# Patient Record
Sex: Male | Born: 1955 | Race: White | Hispanic: No | Marital: Married | Smoking: Never smoker
Health system: Southern US, Community
[De-identification: ages and names within clinical notes are randomized; demographics above are authoritative.]

## PROBLEM LIST (undated history)

## (undated) ENCOUNTER — Emergency Department (HOSPITAL_COMMUNITY): Disposition: A | Discharge status: Home / Self Care | Payer: Self-pay

## (undated) DIAGNOSIS — E279 Disorder of adrenal gland, unspecified: Secondary | ICD-10-CM

## (undated) DIAGNOSIS — C801 Malignant (primary) neoplasm, unspecified: Secondary | ICD-10-CM

## (undated) DIAGNOSIS — D034 Melanoma in situ of scalp and neck: Secondary | ICD-10-CM

## (undated) HISTORY — PX: MELANOMA EXCISION: SHX5266

## (undated) HISTORY — PX: SKIN BIOPSY: SHX1

---

## 2013-12-16 ENCOUNTER — Encounter (HOSPITAL_BASED_OUTPATIENT_CLINIC_OR_DEPARTMENT_OTHER): Payer: Self-pay | Admitting: Emergency Medicine

## 2013-12-16 ENCOUNTER — Emergency Department (HOSPITAL_BASED_OUTPATIENT_CLINIC_OR_DEPARTMENT_OTHER): Payer: 59

## 2013-12-16 ENCOUNTER — Emergency Department (HOSPITAL_BASED_OUTPATIENT_CLINIC_OR_DEPARTMENT_OTHER)
Admission: EM | Admit: 2013-12-16 | Discharge: 2013-12-16 | Disposition: A | Discharge status: Other Acute Inpatient Hospital | Payer: 59 | Attending: Emergency Medicine | Admitting: Emergency Medicine

## 2013-12-16 DIAGNOSIS — R112 Nausea with vomiting, unspecified: Secondary | ICD-10-CM

## 2013-12-16 DIAGNOSIS — Z8639 Personal history of other endocrine, nutritional and metabolic disease: Secondary | ICD-10-CM | POA: Insufficient documentation

## 2013-12-16 DIAGNOSIS — R197 Diarrhea, unspecified: Secondary | ICD-10-CM | POA: Insufficient documentation

## 2013-12-16 DIAGNOSIS — R21 Rash and other nonspecific skin eruption: Secondary | ICD-10-CM | POA: Insufficient documentation

## 2013-12-16 DIAGNOSIS — Z85820 Personal history of malignant melanoma of skin: Secondary | ICD-10-CM | POA: Insufficient documentation

## 2013-12-16 DIAGNOSIS — I959 Hypotension, unspecified: Secondary | ICD-10-CM | POA: Insufficient documentation

## 2013-12-16 DIAGNOSIS — R109 Unspecified abdominal pain: Secondary | ICD-10-CM | POA: Insufficient documentation

## 2013-12-16 DIAGNOSIS — R509 Fever, unspecified: Secondary | ICD-10-CM | POA: Insufficient documentation

## 2013-12-16 DIAGNOSIS — R51 Headache: Secondary | ICD-10-CM | POA: Insufficient documentation

## 2013-12-16 DIAGNOSIS — E860 Dehydration: Secondary | ICD-10-CM | POA: Insufficient documentation

## 2013-12-16 DIAGNOSIS — IMO0002 Reserved for concepts with insufficient information to code with codable children: Secondary | ICD-10-CM | POA: Insufficient documentation

## 2013-12-16 DIAGNOSIS — Z862 Personal history of diseases of the blood and blood-forming organs and certain disorders involving the immune mechanism: Secondary | ICD-10-CM | POA: Insufficient documentation

## 2013-12-16 DIAGNOSIS — Z79899 Other long term (current) drug therapy: Secondary | ICD-10-CM | POA: Insufficient documentation

## 2013-12-16 HISTORY — DX: Melanoma in situ of scalp and neck: D03.4

## 2013-12-16 HISTORY — DX: Disorder of adrenal gland, unspecified: E27.9

## 2013-12-16 HISTORY — DX: Malignant (primary) neoplasm, unspecified: C80.1

## 2013-12-16 LAB — I-STAT CG4 LACTIC ACID, ED
Lactic Acid, Venous: 1.74 mmol/L (ref 0.5–2.2)
Lactic Acid, Venous: 1.16 mmol/L (ref 0.5–2.2)

## 2013-12-16 LAB — CBC WITH DIFFERENTIAL/PLATELET
BASOS ABS: 0 10*3/uL (ref 0.0–0.1)
Basophils Relative: 0 % (ref 0–1)
EOS ABS: 0.1 10*3/uL (ref 0.0–0.7)
EOS PCT: 1 % (ref 0–5)
HCT: 44.1 % (ref 39.0–52.0)
Hemoglobin: 15.1 g/dL (ref 13.0–17.0)
LYMPHS ABS: 1.8 10*3/uL (ref 0.7–4.0)
LYMPHS PCT: 25 % (ref 12–46)
MCH: 30.2 pg (ref 26.0–34.0)
MCHC: 34.2 g/dL (ref 30.0–36.0)
MCV: 88.2 fL (ref 78.0–100.0)
Monocytes Absolute: 0.5 10*3/uL (ref 0.1–1.0)
Monocytes Relative: 7 % (ref 3–12)
NEUTROS PCT: 66 % (ref 43–77)
Neutro Abs: 4.7 10*3/uL (ref 1.7–7.7)
PLATELETS: 193 10*3/uL (ref 150–400)
RBC: 5 MIL/uL (ref 4.22–5.81)
RDW: 13.3 % (ref 11.5–15.5)
WBC: 7.1 10*3/uL (ref 4.0–10.5)

## 2013-12-16 LAB — COMPREHENSIVE METABOLIC PANEL
ALT: 20 U/L (ref 0–53)
AST: 22 U/L (ref 0–37)
Albumin: 3.9 g/dL (ref 3.5–5.2)
Alkaline Phosphatase: 56 U/L (ref 39–117)
BUN: 33 mg/dL — ABNORMAL HIGH (ref 6–23)
CALCIUM: 9.2 mg/dL (ref 8.4–10.5)
CO2: 24 meq/L (ref 19–32)
Chloride: 100 mEq/L (ref 96–112)
Creatinine, Ser: 1.6 mg/dL — ABNORMAL HIGH (ref 0.50–1.35)
GFR calc Af Amer: 53 mL/min — ABNORMAL LOW (ref >90)
GFR calc non Af Amer: 46 mL/min — ABNORMAL LOW (ref >90)
Glucose, Bld: 105 mg/dL — ABNORMAL HIGH (ref 70–99)
POTASSIUM: 3.9 meq/L (ref 3.7–5.3)
SODIUM: 139 meq/L (ref 137–147)
TOTAL PROTEIN: 7.1 g/dL (ref 6.0–8.3)
Total Bilirubin: 1.2 mg/dL (ref 0.3–1.2)

## 2013-12-16 LAB — LIPASE, BLOOD: Lipase: 43 U/L (ref 11–59)

## 2013-12-16 MED ORDER — SODIUM CHLORIDE 0.9 % IV BOLUS (SEPSIS)
1000.0000 mL | Freq: Once | INTRAVENOUS | Status: AC
Start: 2013-12-16 — End: 2013-12-16
  Administered 2013-12-16: 1000 mL via INTRAVENOUS

## 2013-12-16 MED ORDER — ONDANSETRON HCL 4 MG/2ML IJ SOLN
4.0000 mg | Freq: Once | INTRAMUSCULAR | Status: AC
  Administered 2013-12-16: 4 mg via INTRAVENOUS
  Filled 2013-12-16: qty 2

## 2013-12-16 MED ORDER — ACETAMINOPHEN 325 MG PO TABS
650.0000 mg | ORAL_TABLET | Freq: Once | ORAL | Status: AC
Start: 2013-12-16 — End: 2013-12-16
  Administered 2013-12-16: 650 mg via ORAL
  Filled 2013-12-16: qty 2

## 2013-12-16 MED ORDER — ONDANSETRON HCL 4 MG/2ML IJ SOLN: 4.0000 mg | Freq: Once | INTRAMUSCULAR | Status: DC

## 2013-12-16 MED ORDER — METHYLPREDNISOLONE SODIUM SUCC 125 MG IJ SOLR
125.0000 mg | Freq: Once | INTRAMUSCULAR | Status: AC
Start: 2013-12-16 — End: 2013-12-16
  Administered 2013-12-16: 125 mg via INTRAVENOUS
  Filled 2013-12-16: qty 2

## 2013-12-16 MED ORDER — DEXTROSE 5 % IV SOLN
1.0000 g | Freq: Once | INTRAVENOUS | Status: AC
  Administered 2013-12-16: 1 g via INTRAVENOUS

## 2013-12-16 MED ORDER — IOHEXOL 300 MG/ML  SOLN
25.0000 mL | Freq: Once | INTRAMUSCULAR | Status: AC | PRN
  Administered 2013-12-16: 25 mL via ORAL

## 2013-12-16 MED ORDER — CEFTRIAXONE SODIUM 1 G IJ SOLR
INTRAMUSCULAR | Status: AC
  Filled 2013-12-16: qty 10

## 2013-12-16 MED ORDER — HYDROCORTISONE NA SUCCINATE PF 100 MG IJ SOLR
100.0000 mg | Freq: Once | INTRAMUSCULAR | Status: AC
  Administered 2013-12-16: 100 mg via INTRAVENOUS
  Filled 2013-12-16: qty 2

## 2013-12-16 MED ORDER — SODIUM CHLORIDE 0.9 % IV SOLN: INTRAVENOUS | Status: DC

## 2013-12-16 MED ORDER — NOREPINEPHRINE BITARTRATE 1 MG/ML IJ SOLN
2.0000 ug/min | INTRAVENOUS | Status: DC
  Filled 2013-12-16: qty 4

## 2013-12-16 MED ORDER — SODIUM CHLORIDE 0.9 % IV BOLUS (SEPSIS)
1000.0000 mL | Freq: Once | INTRAVENOUS | Status: AC
  Administered 2013-12-16: 1000 mL via INTRAVENOUS

## 2013-12-16 MED ORDER — SODIUM CHLORIDE 0.9 % IV BOLUS (SEPSIS)
1000.0000 mL | Freq: Once | INTRAVENOUS | Status: AC
  Administered 2013-12-16 (×2): 1000 mL via INTRAVENOUS

## 2013-12-16 NOTE — ED Notes (Signed)
Patient is going to room 727 instead of 651--[facility] is picking up patient.

## 2013-12-16 NOTE — ED Notes (Signed)
Pt denies sob, lungs clear to auscultation.  Heart sounds S1, S2, without murmur.  Pt denies need to void at present.

## 2013-12-16 NOTE — ED Notes (Signed)
Paged [facility] direct--call back to [phone].

## 2013-12-16 NOTE — ED Notes (Signed)
Called [facility] back--return call on Dr. Stark Jock phone [phone]

## 2013-12-16 NOTE — ED Notes (Signed)
Dr. Lavada Mesi will be calling back to [phone] from [facility]

## 2013-12-16 NOTE — ED Notes (Addendum)
N/V/D since yesterday.  Pt states he aches all over.  Fever since yesterday.  Pt has eyes closed, he states he feels weak all over.

## 2013-12-16 NOTE — ED Provider Notes (Signed)
Medical screening examination/treatment/procedure(s) were conducted as a shared visit with non-physician practitioner(s) and myself.  I personally evaluated the patient during the encounter.   EKG Interpretation None     Patient returned 4 hours of low-grade temp along with nonbilious vomiting watery diarrhea. Denies abdominal pain. No cough or shortness of breath. Has been hypotensive here. Lactate is normal. We'll continue with fluid hydration. Abdominal exam is benign. Suspect likely viral illness. We'll monitor  Leota Jacobsen, MD 12/16/13 [phone]

## 2013-12-16 NOTE — ED Provider Notes (Signed)
CSN: [id number]     Arrival date & time 12/16/13  1134 History   First MD Initiated Contact with Patient 12/16/13 1208     Chief Complaint  Patient presents with  . Emesis  . Diarrhea     (Consider location/radiation/quality/duration/timing/severity/associated sxs/prior Treatment) Patient is a 58 y.o. male presenting with vomiting and diarrhea. The history is provided by the patient.  Emesis Severity:  Moderate Duration:  1 day Number of daily episodes:  6 times Quality:  Stomach contents Able to tolerate:  Liquids Progression:  Worsening Chronicity:  New Recent urination:  Normal Relieved by:  Nothing Worsened by:  Nothing tried Associated symptoms: abdominal pain, chills, diarrhea, fever, headaches and myalgias   Associated symptoms: no sore throat   Diarrhea Associated symptoms: abdominal pain, chills, fever, headaches, myalgias and vomiting    Platon Arocho is a 58 y.o. male who presents to the ED with nausea, vomiting and diarrhea that started yesterday. He has had brown watery stools x 6. He complains of headache and aching all over. Fever up to 102. No one else in the family has been sick. He has not taken any medications except phenergan PO yesterday and it did not stay down. Patient sees Dr. Jeralene Huff at [facility]. PMH significant for Melanoma in situ of scalp and adrenal gland dysfunction.   Past Medical History  Diagnosis Date  . Cancer   . Melanoma in situ of scalp   . Adrenal gland dysfunction    No past surgical history on file. No family history on file. History  Substance Use Topics  . Smoking status: Never Smoker   . Smokeless tobacco: Not on file  . Alcohol Use: Not on file    Review of Systems  Constitutional: Positive for fever and chills.  HENT: Negative for congestion, ear pain, facial swelling, sinus pressure and sore throat.   Eyes: Negative for visual disturbance.  Respiratory: Negative for chest tightness and shortness of breath.    Cardiovascular: Negative for chest pain.  Gastrointestinal: Positive for nausea, vomiting, abdominal pain and diarrhea.  Genitourinary: Negative for dysuria, urgency, frequency and decreased urine volume.  Musculoskeletal: Positive for myalgias.  Skin: Positive for rash.  Neurological: Positive for light-headedness and headaches. Negative for syncope.  Psychiatric/Behavioral: Negative for confusion. The patient is not nervous/anxious.       Allergies  Review of patient's allergies indicates no known allergies.  Home Medications   Current Outpatient Rx  Name  Route  Sig  Dispense  Refill  . LORazepam (ATIVAN) 0.5 MG tablet   Oral   Take 0.5 mg by mouth at bedtime.         . predniSONE (DELTASONE) 5 MG tablet   Oral   Take 5 mg by mouth daily with breakfast.         . simvastatin (ZOCOR) 10 MG tablet   Oral   Take 10 mg by mouth daily.          BP 96/59  Pulse 87  Temp(Src) 100.6 F (38.1 C) (Oral)  Resp 22  Ht 5\' 11"  (1.803 m)  Wt 155 lb (70.308 kg)  BMI 21.63 kg/m2  SpO2 93% Physical Exam  Nursing note and vitals reviewed. Constitutional: He is oriented to person, place, and time. No distress.  Ill appearing male  HENT:  Head: Normocephalic and atraumatic.  Eyes: EOM are normal. Pupils are equal, round, and reactive to light.  Neck: Normal range of motion. Neck supple.  Cardiovascular: Normal rate and regular rhythm.  Pulmonary/Chest: Effort normal. He has decreased breath sounds. He has no wheezes. He has no rales.  Abdominal: Soft. Bowel sounds are normal. There is no tenderness.  Patient states he only has cramping pain when he is having diarrhea.  Musculoskeletal: Normal range of motion.  Neurological: He is alert and oriented to person, place, and time. No cranial nerve deficit.  Skin:  Erythematous, blanching rash to chest arms, abdomen and back.   Psychiatric: He has a normal mood and affect. His behavior is normal.   Results for orders placed  during the hospital encounter of 12/16/13 (from the past 24 hour(s))  CBC WITH DIFFERENTIAL     Status: None   Collection Time    12/16/13 11:55 AM      Result Value Ref Range   WBC 7.1  4.0 - 10.5 K/uL   RBC 5.00  4.22 - 5.81 MIL/uL   Hemoglobin 15.1  13.0 - 17.0 g/dL   HCT 44.1  39.0 - 52.0 %   MCV 88.2  78.0 - 100.0 fL   MCH 30.2  26.0 - 34.0 pg   MCHC 34.2  30.0 - 36.0 g/dL   RDW 13.3  11.5 - 15.5 %   Platelets 193  150 - 400 K/uL   Neutrophils Relative % 66  43 - 77 %   Neutro Abs 4.7  1.7 - 7.7 K/uL   Lymphocytes Relative 25  12 - 46 %   Lymphs Abs 1.8  0.7 - 4.0 K/uL   Monocytes Relative 7  3 - 12 %   Monocytes Absolute 0.5  0.1 - 1.0 K/uL   Eosinophils Relative 1  0 - 5 %   Eosinophils Absolute 0.1  0.0 - 0.7 K/uL   Basophils Relative 0  0 - 1 %   Basophils Absolute 0.0  0.0 - 0.1 K/uL  COMPREHENSIVE METABOLIC PANEL     Status: Abnormal   Collection Time    12/16/13 11:55 AM      Result Value Ref Range   Sodium 139  137 - 147 mEq/L   Potassium 3.9  3.7 - 5.3 mEq/L   Chloride 100  96 - 112 mEq/L   CO2 24  19 - 32 mEq/L   Glucose, Bld 105 (*) 70 - 99 mg/dL   BUN 33 (*) 6 - 23 mg/dL   Creatinine, Ser 1.60 (*) 0.50 - 1.35 mg/dL   Calcium 9.2  8.4 - 10.5 mg/dL   Total Protein 7.1  6.0 - 8.3 g/dL   Albumin 3.9  3.5 - 5.2 g/dL   AST 22  0 - 37 U/L   ALT 20  0 - 53 U/L   Alkaline Phosphatase 56  39 - 117 U/L   Total Bilirubin 1.2  0.3 - 1.2 mg/dL   GFR calc non Af Amer 46 (*) >90 mL/min   GFR calc Af Amer 53 (*) >90 mL/min  LIPASE, BLOOD     Status: None   Collection Time    12/16/13 11:55 AM      Result Value Ref Range   Lipase 43  11 - 59 U/L  I-STAT CG4 LACTIC ACID, ED     Status: None   Collection Time    12/16/13 12:38 PM      Result Value Ref Range   Lactic Acid, Venous 1.74  0.5 - 2.2 mmol/L   Dg Chest 2 View  12/16/2013   CLINICAL DATA:  Fever, weakness, body aches, vomiting and diarrhea.  EXAM: CHEST -  2 VIEW  COMPARISON:  None  FINDINGS: The heart  size and mediastinal contours are within normal limits. There is no evidence of pulmonary edema, consolidation, pneumothorax, nodule or pleural fluid. The visualized skeletal structures are unremarkable.  IMPRESSION: No active disease.   Electronically Signed   By: Aletta Edouard M.D.   On: 12/16/2013 12:56    ED Course  Procedures  After 3,000 ccs of NSS IV patient is still unable to void and remains hypotensive.  MDM  Dr. Zenia Resides in to examine the patient. We will admit to [facility] per patient request. I spoke with Dr. Karsten Ro at [facility] and he request CT of abdomen without contrast prior to transfer.  @ 1650 CT results back,  I spoke with Dr. Karsten Ro and he will accept the patient.  @1650  RN states that patient blood pressure continues to drop and bladder scan shows only 130 cc despite over 3 L of IV fluid.  Dr. Stark Jock in to examine the patient @ 1655. Since patient has been unable to keep down his prednisone will give Solumedrol 125 mg. IV. 58 y.o. male with nausea, vomiting and diarrhea x 1 day. He is hypotensive, febrile and dehydrated.  Unable to keep down any medications. Will transfer to [facility] for admission to room 651.  Stable for transfer.  Consider adrenal insufficiency, dehydration or problems related to melanoma.     Bell Arthur, NP 12/16/13 [place], NP 12/16/13 1714  NOTE: @ 1730 patient's family request that rather than going to [facility] as they originally requested that he be transferred to [facility] where the Oncologist will be able to see him in addition to the medical doctors. Dr. Stark Jock called and spoke with the patient's oncologist and he will arrange for admission.   [place], [place] 12/16/13 [phone]

## 2013-12-17 NOTE — ED Provider Notes (Signed)
Medical screening examination/treatment/procedure(s) were conducted as a shared visit with non-physician practitioner(s) and myself.  I personally evaluated the patient during the encounter.   EKG Interpretation None       Leota Jacobsen, MD 12/17/13 1506

## 2013-12-18 LAB — RESPIRATORY VIRUS PANEL
Adenovirus: NOT DETECTED
INFLUENZA A H1: NOT DETECTED
INFLUENZA A H3: NOT DETECTED
INFLUENZA B 1: NOT DETECTED
Influenza A: NOT DETECTED
Metapneumovirus: NOT DETECTED
Parainfluenza 1: NOT DETECTED
Parainfluenza 2: NOT DETECTED
Parainfluenza 3: NOT DETECTED
RESPIRATORY SYNCYTIAL VIRUS A: NOT DETECTED
RESPIRATORY SYNCYTIAL VIRUS B: NOT DETECTED
Rhinovirus: NOT DETECTED

## 2013-12-21 ENCOUNTER — Telehealth (HOSPITAL_BASED_OUTPATIENT_CLINIC_OR_DEPARTMENT_OTHER): Payer: Self-pay

## 2013-12-21 NOTE — Telephone Encounter (Signed)
Call from [facility] w/(+) Prelim. Bld cx. Anaerobic bottle -> Gram (+) rods.  Report called to [facility] where pt was transferred to.

## 2013-12-22 LAB — CULTURE, BLOOD (ROUTINE X 2): Culture: NO GROWTH

## 2013-12-23 LAB — CULTURE, BLOOD (ROUTINE X 2)

## 2015-07-06 IMAGING — CT CT ABD-PELV W/O CM
2 of 4 series · 17 of 46 positions shown, 19 images · non-contrast
Comparison: None.

CLINICAL DATA: Vomiting, diarrhea, fever.

EXAM:
CT ABDOMEN AND PELVIS WITHOUT CONTRAST
TECHNIQUE: Multidetector CT imaging of the abdomen and pelvis was performed
following the standard protocol without intravenous contrast.

[Series 2: abd/pelvis 5.0 b31f · axial · 0.71mm/px · z∈[-521,-71]mm · 14 of 98 slices shown, 16 images]
[im 4/98  soft-tissue]
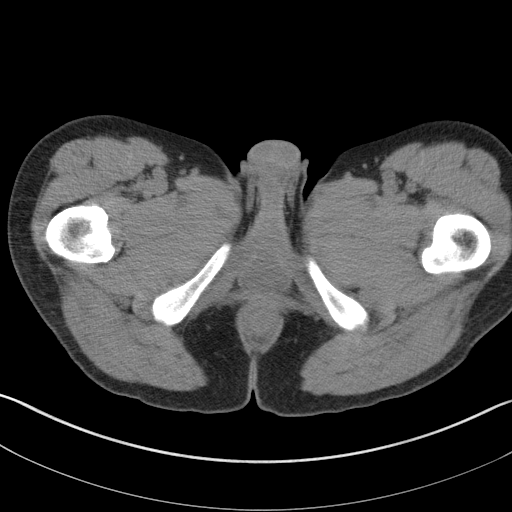
[im 4/98  bone]
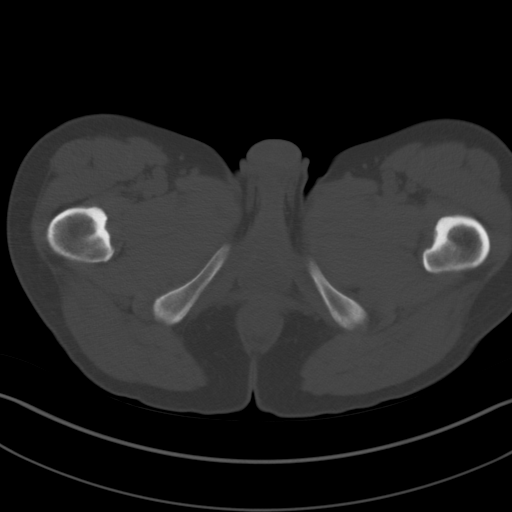
[im 12/98  soft-tissue]
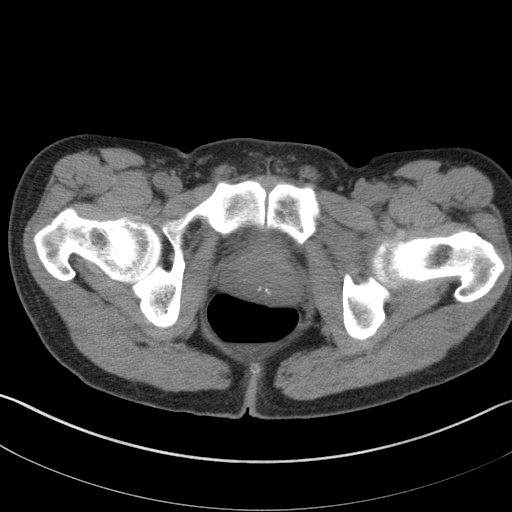
[im 20/98  soft-tissue]
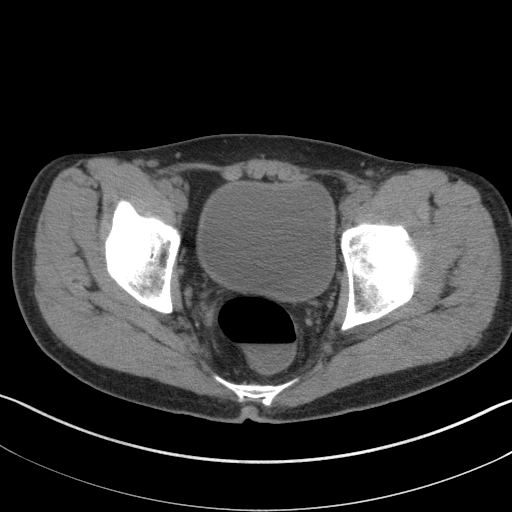
[im 28/98  soft-tissue]
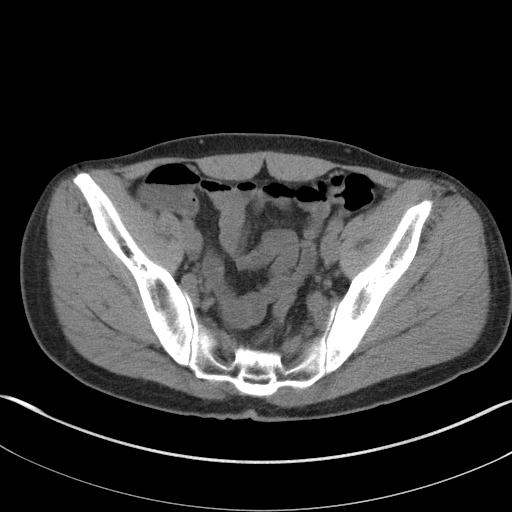
[im 32/98  soft-tissue]
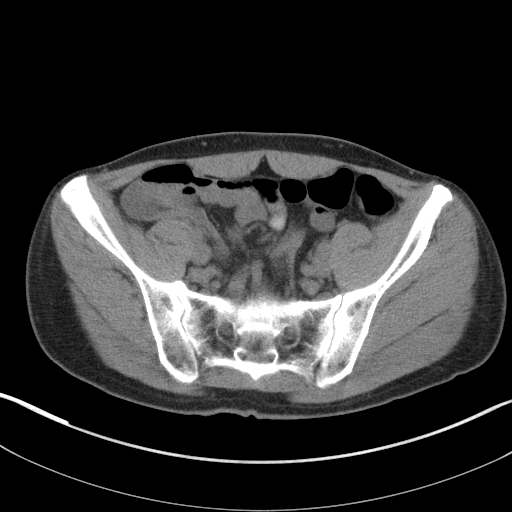
[im 39/98  soft-tissue]
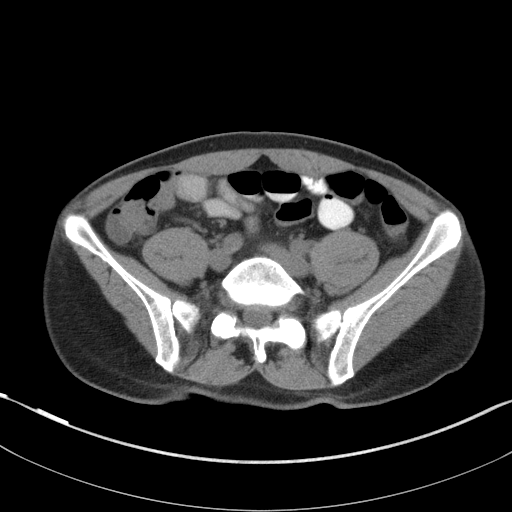
[im 47/98  soft-tissue]
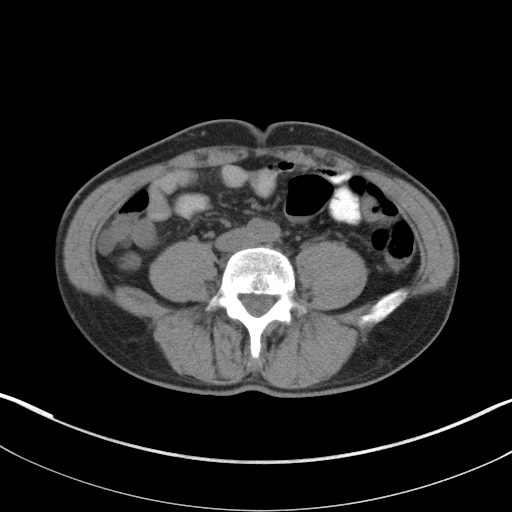
[im 51/98  soft-tissue]
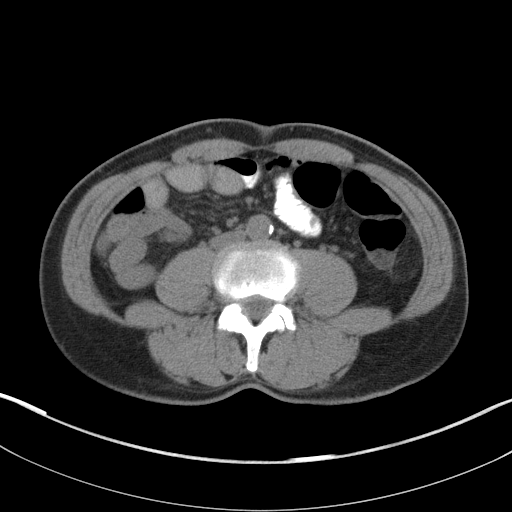
[im 59/98  soft-tissue]
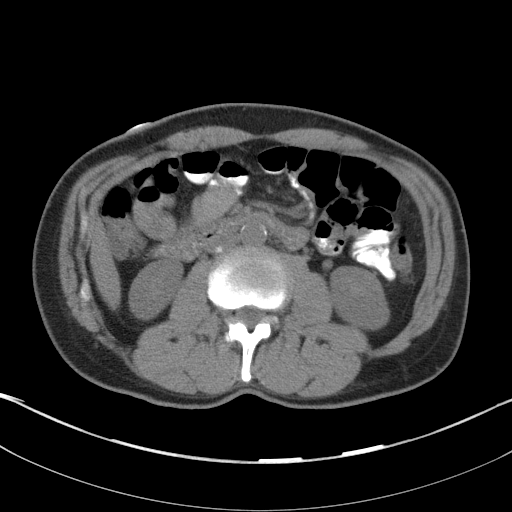
[im 59/98  bone]
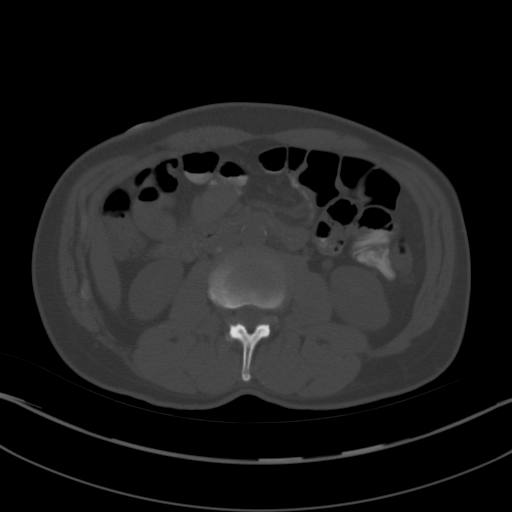
[im 66/98  soft-tissue]
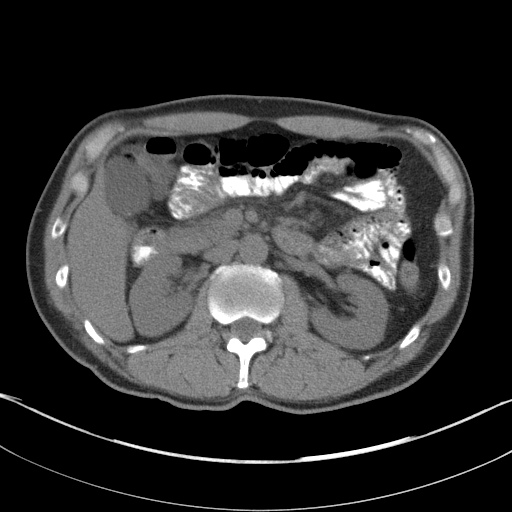
[im 74/98  soft-tissue]
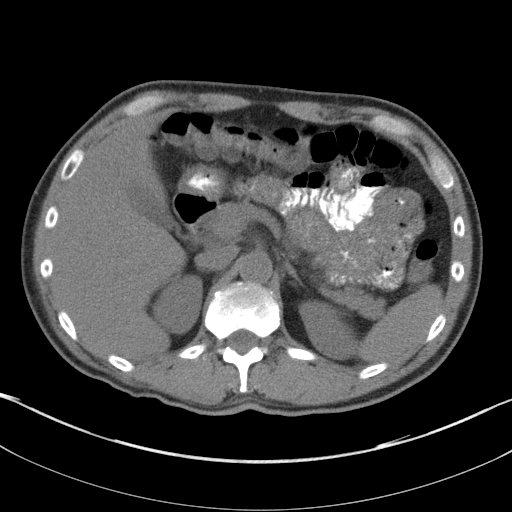
[im 78/98  soft-tissue]
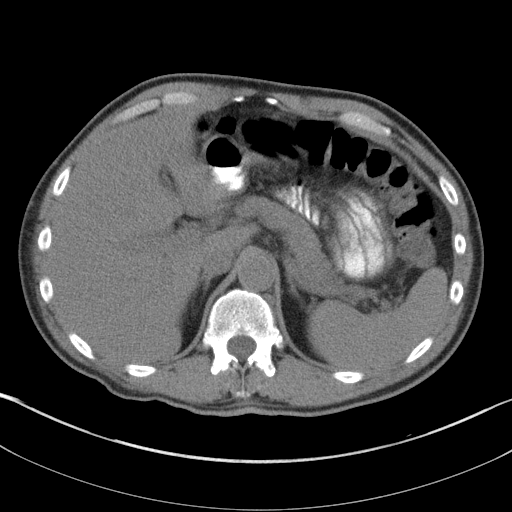
[im 86/98  soft-tissue]
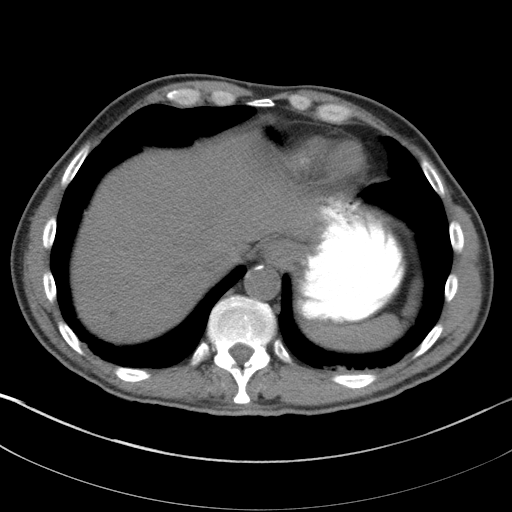
[im 94/98  soft-tissue]
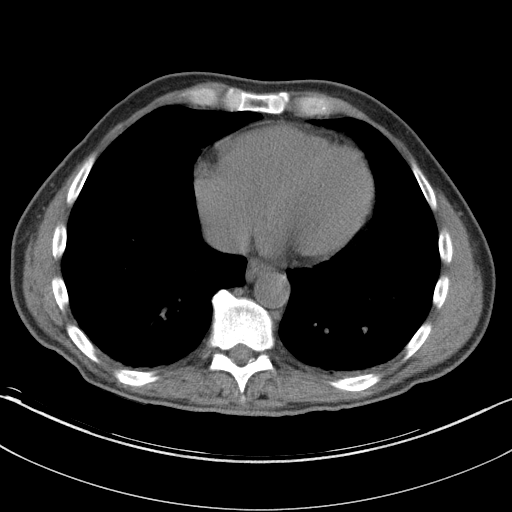

[Series 5: abd/pelvis 3.0 coronal · coronal · 0.79mm/px · 3 of 71 slices shown]
[im 24/71  soft-tissue]
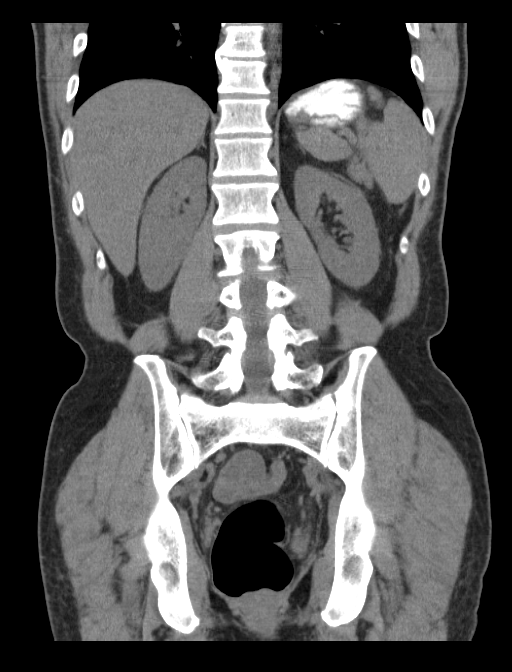
[im 32/71  soft-tissue]
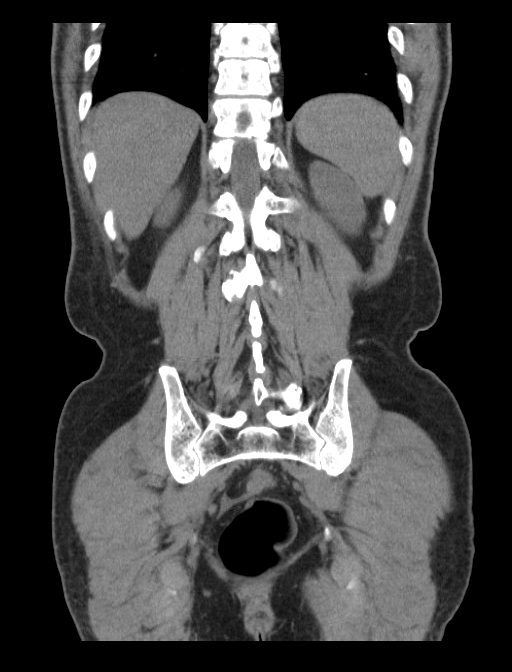
[im 39/71  soft-tissue]
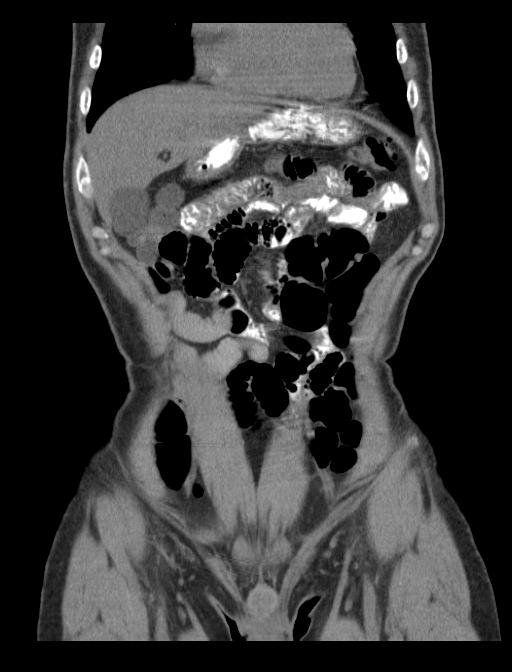

[17 of 46 positions shown; findings below may reference images not displayed]

FINDINGS: Lung bases are clear.  No effusions.  Heart is normal size.

Small hypodensities noted in the hepatic dome, too small to
characterize on this unenhanced study but most likely small cysts.
Gallbladder, spleen, pancreas, adrenals and kidneys have an
unremarkable unenhanced appearance.

Urinary bladder is unremarkable.  Prostate mildly prominent.

Appendix is not definitively seen. No inflammatory process in the
right lower quadrant. Stomach, large and small bowel are grossly
unremarkable. Aorta scratch head scattered calcifications in the
aorta. No aneurysm. No free fluid, free air or adenopathy
IMPRESSION: No acute findings on this unenhanced study.

Mild prostate enlargement.

## 2016-12-20 DEATH — deceased
